# Patient Record
Sex: Female | Born: 1935 | Race: White | Hispanic: No | Marital: Married
Health system: Southern US, Community
[De-identification: ages and names within clinical notes are randomized; demographics above are authoritative.]

---

## 2011-04-30 ENCOUNTER — Ambulatory Visit: Payer: Self-pay | Admitting: Orthopedic Surgery

## 2011-05-14 ENCOUNTER — Ambulatory Visit: Payer: Self-pay | Admitting: Orthopedic Surgery

## 2011-05-29 ENCOUNTER — Inpatient Hospital Stay: Payer: Self-pay | Admitting: Orthopedic Surgery

## 2013-10-28 ENCOUNTER — Ambulatory Visit: Payer: Self-pay | Admitting: Orthopedic Surgery

## 2013-10-28 LAB — URINALYSIS, COMPLETE
Bacteria: NONE SEEN
Bilirubin,UR: NEGATIVE
Blood: NEGATIVE
Glucose,UR: NEGATIVE mg/dL (ref 0–75)
Ketone: NEGATIVE
Leukocyte Esterase: NEGATIVE
Nitrite: NEGATIVE
Protein: NEGATIVE
RBC,UR: <1 /HPF (ref 0–5)

## 2013-10-28 LAB — BASIC METABOLIC PANEL
Anion Gap: 3 — ABNORMAL LOW (ref 7–16)
BUN: 18 mg/dL (ref 7–18)
Calcium, Total: 9.1 mg/dL (ref 8.5–10.1)
Creatinine: 1.21 mg/dL (ref 0.60–1.30)
EGFR (Non-African Amer.): 43 — ABNORMAL LOW
Potassium: 4.1 mmol/L (ref 3.5–5.1)
Sodium: 142 mmol/L (ref 136–145)

## 2013-10-28 LAB — CBC
HCT: 37.9 % (ref 35.0–47.0)
HGB: 12.7 g/dL (ref 12.0–16.0)
MCH: 29.6 pg (ref 26.0–34.0)
MCV: 88 fL (ref 80–100)
Platelet: 141 10*3/uL — ABNORMAL LOW (ref 150–440)
RBC: 4.29 10*6/uL (ref 3.80–5.20)
RDW: 13.8 % (ref 11.5–14.5)
WBC: 6.5 10*3/uL (ref 3.6–11.0)

## 2013-11-04 ENCOUNTER — Inpatient Hospital Stay: Payer: Self-pay | Admitting: Orthopedic Surgery

## 2013-11-05 LAB — BASIC METABOLIC PANEL
Anion Gap: 8 (ref 7–16)
BUN: 12 mg/dL (ref 7–18)
Co2: 22 mmol/L (ref 21–32)
Creatinine: 1.05 mg/dL (ref 0.60–1.30)
EGFR (African American): 59 — ABNORMAL LOW
EGFR (Non-African Amer.): 51 — ABNORMAL LOW
Glucose: 97 mg/dL (ref 65–99)
Osmolality: 277 (ref 275–301)
Potassium: 3.6 mmol/L (ref 3.5–5.1)
Sodium: 139 mmol/L (ref 136–145)

## 2013-11-05 LAB — PLATELET COUNT: Platelet: 125 10*3/uL — ABNORMAL LOW (ref 150–440)

## 2013-11-06 LAB — PATHOLOGY REPORT

## 2013-11-07 LAB — CBC WITH DIFFERENTIAL/PLATELET
Basophil %: 0.4 %
Eosinophil %: 0.8 %
HCT: 29.9 % — ABNORMAL LOW (ref 35.0–47.0)
HGB: 10.2 g/dL — ABNORMAL LOW (ref 12.0–16.0)
Lymphocyte #: 1.5 10*3/uL (ref 1.0–3.6)
Lymphocyte %: 17.4 %
MCHC: 33.9 g/dL (ref 32.0–36.0)
MCV: 88 fL (ref 80–100)
Neutrophil #: 6.6 10*3/uL — ABNORMAL HIGH (ref 1.4–6.5)
Neutrophil %: 74.3 %
RBC: 3.39 10*6/uL — ABNORMAL LOW (ref 3.80–5.20)
RDW: 13.4 % (ref 11.5–14.5)
WBC: 8.9 10*3/uL (ref 3.6–11.0)

## 2013-11-07 LAB — URINALYSIS, COMPLETE
Bilirubin,UR: NEGATIVE
Blood: NEGATIVE
Nitrite: NEGATIVE
Specific Gravity: 1.024 (ref 1.003–1.030)

## 2013-11-12 LAB — CULTURE, BLOOD (SINGLE)

## 2015-03-18 NOTE — Consult Note (Signed)
Brief Consult Note: Diagnosis: 81 yr old female with feber,pleuritic chest pain and pneumonia.   Patient was seen by consultant.   Consult note dictated.   Comments: HCAP;on levaquin,follow wbc,blood cultures,fever curve,if afbrile,can d/c to rehab tomorrow with levaquin  750 mg daily for 5 days.  Electronic Signatures: Katha HammingKonidena, Tonilynn Bieker (MD)  (Signed 13-Dec-14 10:17)  Authored: Brief Consult Note   Last Updated: 13-Dec-14 10:17 by Katha HammingKonidena, Barak Bialecki (MD)

## 2015-03-18 NOTE — Discharge Summary (Signed)
PATIENT NAME:  Jessica Burton, Jessica Burton MR#:  [id number] DATE OF BIRTH:  Apr 25, 1934  DATE OF ADMISSION:  11/04/2013 DATE OF DISCHARGE:  11/07/2013   ADMITTING DIAGNOSIS:  Right hip osteoarthritis.   DISCHARGE DIAGNOSIS:  Right hip osteoarthritis.   PROCEDURE:  Right total hip replacement.   ANESTHESIA:  Spinal.   SURGEON:  Leitha Schuller, M.D.   ASSISTANT:  Cranston Neighbor, PA-C.   ESTIMATED BLOOD LOSS:  300 mL.   SPECIMENS:  Femoral head.   IMPLANTS:  Medacta Versa Fit cup DM size 50 with associated liner and S-28 mm head and a 3 lateralized AMS stem.   HISTORY OF PRESENT ILLNESS:  The patient is a 81 year old female who has had prior total knee replacement and did well. She has been having increasing pain in the right hip and no arthritis has been diagnosed on x-rays years ago. The patient has persistent groin pain that radiates to the inner thigh. The pain awakens her at night and she has difficulty with ambulation and activities of a day. She has difficulty getting around and performing activities of daily living secondary to pain.   PHYSICAL EXAMINATION:  LUNGS:  Clear to auscultation.  HEART:  Regular rate and rhythm.  HEENT:  Remarkable for partial lower dentures.  EXTREMITY EXAMINATION:  On exam she has 10 degrees internal rotation of the hip and 20 degrees of external rotation of the right hip. She has 20 degrees of internal rotation, 30 degrees of external rotation of the left hip. She has a slight hip flexion contracture. Distally she is neurovascularly intact. She is able to flex and extend the toes.   HOSPITAL COURSE:  The patient was admitted to the hospital on 11/04/2013. She had surgery that same day and was brought to the orthopaedic floor from the PAC-U in stable condition. On postoperative day 1, the patient's labs were stable. She had a hemoglobin of 11.2. Vital signs were stable. She did have a slight fever of 101.8. These did improve through the day with Tylenol. She had no  chest pain, shortness of breath or wheezing. The patient's vital signs remained stable. She had good progress with physical therapy on postoperative day 1 and 2. On postoperative day 2, the patient's vital signs remained stable. She had good progress with physical therapy. Her pain was well controlled. On postoperative day 3, the patient experienced fevers of 101.8, CXR/UA/cultures/CBC were ordered which showed early infiltrate in left lobe. Patient was seen by medicine, medicine started patient on levaquin IV, then recomended po levaquin  daily for 5 days. On post op day 4, patients fevers were improved. Patient progressed very well with therapy. She was ready for discharge to rehab facility. Condition to rehab stable.   CONDITION AT DISCHARGE:  Stable.   DISCHARGE INSTRUCTIONS:  The patient may gradually increase weight-bearing on the affected extremity. Thigh-high TED hose on both legs and remove 1 hour per 8-hour shift. Elevate the heels off the bed. Use incentive spirometry every hour while awake and encouraged cough and deep breathing. No concentrated sweets or sugar. Apply an ice pack to the affected area. Do not get the dressing or bandage wet or dirty. Call [facility] if the dressing gets water under it. Leave the dressing on. Call [facility] if any of the following occur:  Bright-red bleeding from the incision or wound, fever above 101.5 degrees, redness, swelling, or drainage at the incision. Call [facility] if the dressing gets water under it, also if  he has had any increased leg pain, numbness or weakness in legs or bowel or bladder symptoms. Physical therapy has been arranged at the rehab facility. She should follow up in [facility] in two weeks for staple removal.   DISCHARGE MEDICATIONS:  1.  Simvastatin 20 mg 1 tablet orally once a day at bedtime.  2.  Evista 60 mg 1 tab orally once a day in the morning.  3.  Protonix 40 mg 1  tablet orally once a day in the morning.  4.  Melatonin 3 mg 1 tab orally once day at bedtime.  5.  Amoxicillin 500 mg 4 caps orally once a day as needed for one hour prior to dental procedure.  6.  Vitamin D3 1000 international units one cap orally once a day.  7.  Metoprolol 100 mg 1 tablet orally once a day in the morning.  8.  Pepcid AC 20 mg 1 tablet orally once a day in the evening.  9.  Muro 128 5% ophthalmic solution 1 drop to each affected eye 2 times a day. 10.  Senna Plus 50 mg/8.6 mg oral tablet 1 tablet orally once a day at bedtime.  11.  Gas-X extra strength 1 tab orally once a day as needed. 12.  Biofreeze apply topically to affected area once a day as needed.  13.  Losartan 50 mg oral tablet 1 tablet orally once a day in the morning.  14.  Tylenol 500 mg oral tablet 1 tablet orally every 4 hours as needed for pain or temperature greater than 100.4.  15.  Nucynta 50 mg oral tablet 1 tablet orally every 4 to 6 hours as needed for pain. 16.  Magnesium hydroxide 8% oral suspension 30 mL orally 2 times a day as needed for constipation,  17.  Mylanta 30 mL orally every 6 hours as needed for indigestion or heartburn.  18.  Clonidine 0.1 mg oral tablet 1 tablet orally every 8 hours as needed for systolic blood pressure over 160.  19.  Lovenox 40 mg subcutaneous once a day x 14 days.  20.  Bisacodyl 10 mg rectal suppository one suppository rectally daily as needed for constipation.  ____________________________ T. Cranston Neighborhris Norina Cowper, PA-C tcg:jm D: 11/06/2013 12:43:00 ET T: 11/06/2013 13:23:14 ET JOB#: [id number]  cc: T. Cranston Neighborhris Makinzy Cleere, PA-C, <Dictator> Evon SlackHOMAS C Marvie Brevik GeorgiaPA ELECTRONICALLY SIGNED 11/17/2013 8:00

## 2015-03-18 NOTE — Op Note (Signed)
PATIENT NAME:  Jessica Burton, Jessica Burton MR#:  [id number] DATE OF BIRTH:  06/30/1934  DATE OF PROCEDURE:  11/04/2013  PREOPERATIVE DIAGNOSIS: Right hip osteoarthritis.   POSTOPERATIVE DIAGNOSIS: Right hip osteoarthritis.   PROCEDURE: Right total hip replacement.   ANESTHESIA: Spinal.   SURGEON: Kennedy BuckerMichael Akiva Brassfield, M.D.   ASSISTANT:  Cranston Neighborhris Gaines, PA-C.   PROCEDURE: Right total hip replacement.   DESCRIPTION OF PROCEDURE: The patient was brought to the operating room and after adequate anesthesia was obtained, the hip was prepped and draped in the usual sterile fashion. After patient identification, timeout procedures were completed, and having the right foot in the Medacta attachment and preprocedure picture taken, direct anterior approach was made with the incision centered over the greater trochanter and in line with the tensor fascia lata.  The incision was carried down through the skin and subcutaneous tissue with hemostasis being achieved with electrocautery. The TFL fascia was incised and the muscle retracted laterally. The deep fascia incised, and then the lateral femoral circumflex vessels were ligated. The anterior capsule was then exposed and a flap created to allow for exposure of the neck. A neck cut was then carried out and the head removed. Inspection the head revealed significant degenerative change present. The labrum was excised and sequential reaming was carried out after excising the ligamentum teres to 50 mm, at which point there was good bleeding bone and a stable trial implant. The final implant was impacted into place and lateral opening and anteversion appeared appropriate. Next, the leg was externally rotated. Pubofemoral and ischiofemoral ligaments released to allow for adequate mobilization and external rotation of the hip, followed by extension and adduction. The femur was prepared with a box osteotome, followed by sequential broaching. A #3 gave a tight fit and trials were placed. The  lateralized neck gave a better match to the preop template, and so the final stem chosen was the 3 lateral AMIS stem. This was impacted into place, and based on the trials, an S-28 mm head and dual mobility liner for the 50 mm dual mobility cup were assembled and impacted onto the head. The acetabulum was checked and was free of any debris. The hip was reduced and was very stable, stable to 90 degrees of external rotation test. The wound was thoroughly irrigated prior to closure. A running heavy quill suture was used for the deep fascia with 30 mL of 0.25% Sensorcaine with epinephrine infiltrated in the periarticular tissues. A subcutaneous drain was placed, followed by 2-0 quill subcutaneously, skin staples, Xeroform, 4 x 4's, ABD and tape. Leg lengths appeared equal clinically and during x-rays taken during the procedure.  Condition to recovery room is stable.   ESTIMATED BLOOD LOSS: 300.   SPECIMENS: Femoral head.   IMPLANTS: Medacta Versafitcup DM size 50 with associated liner, an S-28 mm head and a 3  lateralized AMIS stem.    ____________________________ Leitha SchullerMichael J. Sheria Rosello, MD mjm:dmm D: 11/04/2013 09:41:04 ET T: 11/04/2013 09:56:19 ET JOB#: [id number]  cc: Leitha SchullerMichael J. Sharika Mosquera, MD, <Dictator> Leitha SchullerMICHAEL J Thornton Dohrmann MD ELECTRONICALLY SIGNED 11/04/2013 17:38

## 2015-03-19 NOTE — Consult Note (Signed)
PATIENT NAME:  Jessica FredricksonDRUMHELLER, Chloris MR#:  [id number] DATE OF BIRTH:  1934-04-19  DATE OF CONSULTATION:  11/07/2013  CONSULTING PHYSICIAN:  Katha HammingSnehalatha Sherre Wooton, MD  [place]  [place]    REQUESTING PHYSICIAN:  Dr. Rosita KeaMenz.   REASON FOR CONSULTATION:   Fever and pneumonia.   HISTORY OF PRESENT ILLNESS:   A 81 year old female patient admitted to the orthopedic service for hip replacement. The patient had total hip arthroplasty on the right side by Dr. Rosita KeaMenz and she is supposed to go to rehab today, but she developed fever last night and this morning temperature is up to 101.7. The patient is having a cough since yesterday, unable to get the phlegm out. No trouble breathing. The patient also has pleuritic chest pain on both right side and left side. The patient denies any abdominal pain. No nausea. No vomiting. No dysuria.   PAST MEDICAL HISTORY:  Significant for hypertension, hyperlipidemia, and osteoporosis.   ALLERGIES: ACE INHIBITORS, CODEINE AND ZOFRAN,  contrast dye SOCIAL HISTORY: No smoking. No drinking. No drug.   PAST SURGICAL HISTORY: Significant for histor> knee replacement and also right hip surgery now. Left knee arthroscopy, history of bilateral oophorectomy, history of cataract surgery, history of C-section and appendectomy.    PRESENT MEDICATIONS:  Losartan 50 mg in the morning, milk of magnesium as needed, melatonin 3 mg at bedtime, metoprolol succinate ER 100 mg in the morning. She is on morphine for pain and she is also on clonidine 0.1 mg every 8 hours p.r.n. She is on Dulcolax, famotidine 20 mg daily. She is also on Senna Plus for constipation, and she is on Lovenox 30 mg q.12 hours.   FAMILY HISTORY: No hypertension or diabetes.   SOCIAL HISTORY:  [place] in [place] she came here to have surgery with Dr. Rosita KeaMenz.   REVIEW OF SYSTEMS:   CONSTITUTIONAL: Has fever and some cough.  CARDIOVASCULAR: Has pleuritic chest  pain. PULMONARY: As cough.    GASTROINTESTINAL: No nausea. No vomiting. No diarrhea.  GENITOURINARY: No dysuria.  MUSCULOSKELETAL: Had hip replacement, getting physical therapy for that.  NEUROLOGIC: No history of strokes or TIAs.  PSYCHIATRIC: No anxiety or insomnia.   PHYSICAL EXAMINATION: VITAL SIGNS: Temperature 101.7 his maximum and heart rate is 75, blood pressure 156/69, sats 98% at rest on room air.  GENERAL: The patient is alert, awake, oriented, elderly female, not in distress. Answering questions appropriately.  HEENT: Head atraumatic, normocephalic. Pupils equally reacting to light. No conjunctival congestion.  ENT: The patient has no tympanic membrane congestion.  NOSE: The patient complains of nose bleed last night due to dry air.  THROAT: No oropharyngeal erythema. Has dry mucosa.  NECK: Supple. No masses. No lymphadenopathy. Thyroid in the midline.  RESPIRATORY: Normal respiratory effort. Clear to auscultation. Both sides. No wheezing. No crackles.  CARDIOVASCULAR: S1, S2 regular rate.  The patient has no murmurs,  Positive carotid upstroke. No pedal edema. The patient has good peripheral pulse. No JVD.  ABDOMEN: Soft, nontender, nondistended. Bowel sounds present. No organomegaly. No hernias. The patient has no CVA tenderness.  LYMPHATICS: No lymphadenopathy in cervical or axillary.  EXTREMITIES: No cyanosis.  SKIN: Warm and dry. Skin turgor is normal. No skin ulcers. NEUROLOGICAL: Cranial nerves II through 12 intact. Power 5/5 upper and lower extremities. Following commands. Sensation is intact. Deep tendon reflexes 2+ bilaterally.  PSYCHIATRIC: Oriented to time, place, person.   LABORATORY DATA: Chest x-ray this morning shows left basilar  atelectasis, could represent early infiltrate.   WBC 8.9, hemoglobin 10.2, hematocrit 29.9, platelets 124.   ELECTROLYTES: From 12/11 showed sodium 139, potassium 3.6, chloride 109, bicarbonate 22, BUN 12, creatinine 1, glucose 97.    ASSESSMENT AND PLAN:   1.  The patient is a 81 year old female with pneumonia with cough and fever. Please keep the patient in the hospital today. Continue IV Levaquin and follow the blood cultures and follow the fever curve. If the patient is afebrile today, she can go to rehab tomorrow with Levaquin for 5 days 750 mg daily.  2.  Hypertension. Blood pressure is controlled. She is on Losartan and also metoprolol. Continue that.  3.  History of hip arthroplasty. Physical therapy with deep vein thrombosis prophylaxis as per the orthopedics.  4.  Pleuritic chest pain. Continue Tylenol as they are doing now and the patient is also on Nucynta but advise the patient to [place] incentive spirometry.  Condition is stable.   Thank you for asking [place] to see this patient.   TIME SPENT: About 55 minutes on this consult.     ____________________________ Katha Hamming, MD sk:dp D: 11/07/2013 10:16:06 ET T: 11/07/2013 10:42:49 ET JOB#: [id number]  cc: Katha Hamming, MD, <Dictator> Katha Hamming MD ELECTRONICALLY SIGNED 11/29/2013 17:09

## 2015-07-17 IMAGING — CR DG C-ARM 1-60 MIN
2 series · 2 of 2 positions shown · non-contrast
Comparison: None.

CLINICAL DATA: Fracture reduction and fixation.

EXAM:
Intraoperative radiographs.

[cont. (1 of 2)]
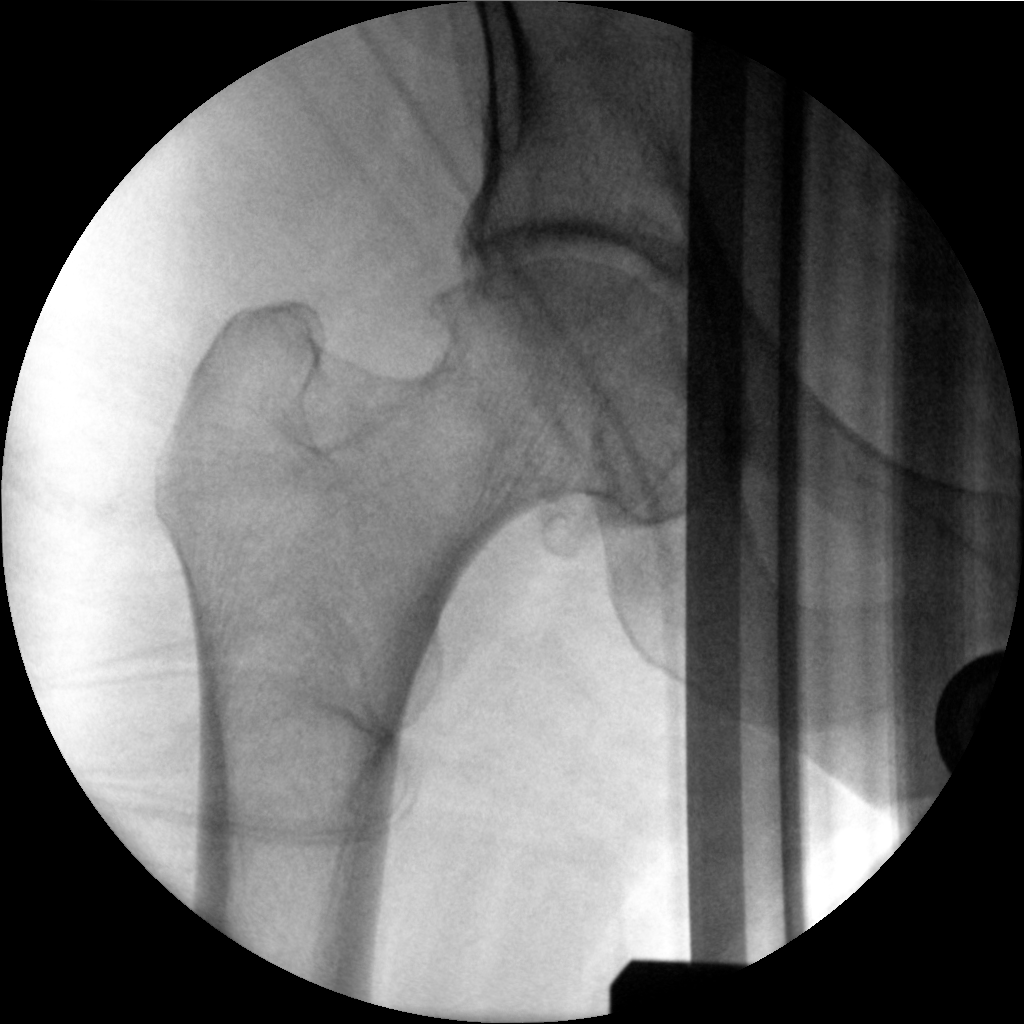

[cont. (2 of 2)]
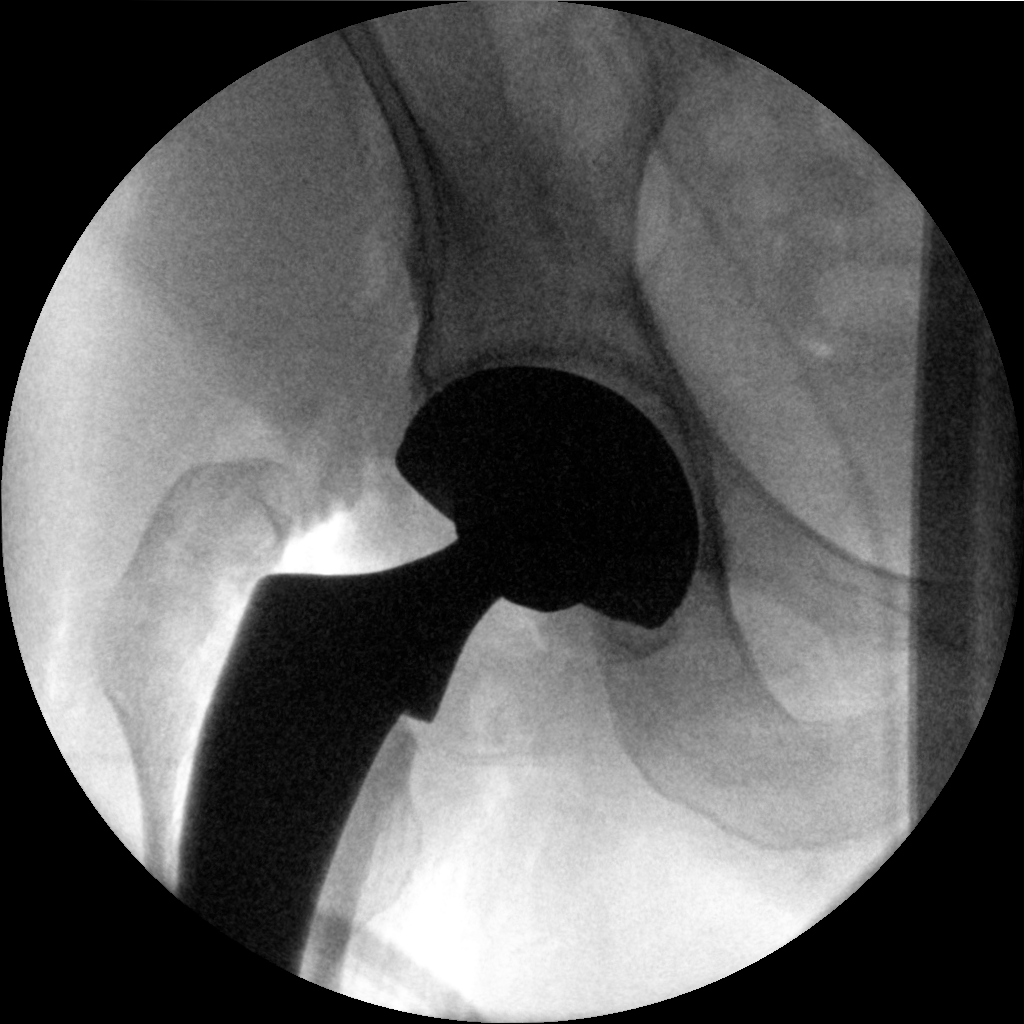

[2 of 2 positions shown; findings below may reference images not displayed]

FINDINGS: Two intraoperative fluoro spot images are provided. The patient is
status post right total hip arthroplasty on the 2nd image. The
entirety of the femoral component is not visualized. The hip appears
located. A 2nd view was not provided to confirm.
IMPRESSION: 1. Status post right total hip arthroplasty without radiographic
evidence for complication.
2. The distal portion of the femoral component is incompletely
visualized.
3. A 2nd view is not provided to confirm that the hip is located.
The arthroplasty does appear located.

:

## 2015-07-20 IMAGING — CR DG CHEST 2V
1 series · 2 of 2 positions shown · non-contrast
Comparison: None.

CLINICAL DATA: Fever and cough

EXAM:
CHEST  2 VIEW

[Series 1: w chest pa · 0.14mm/px · 2 of 2 slices shown]
[im 1/2]
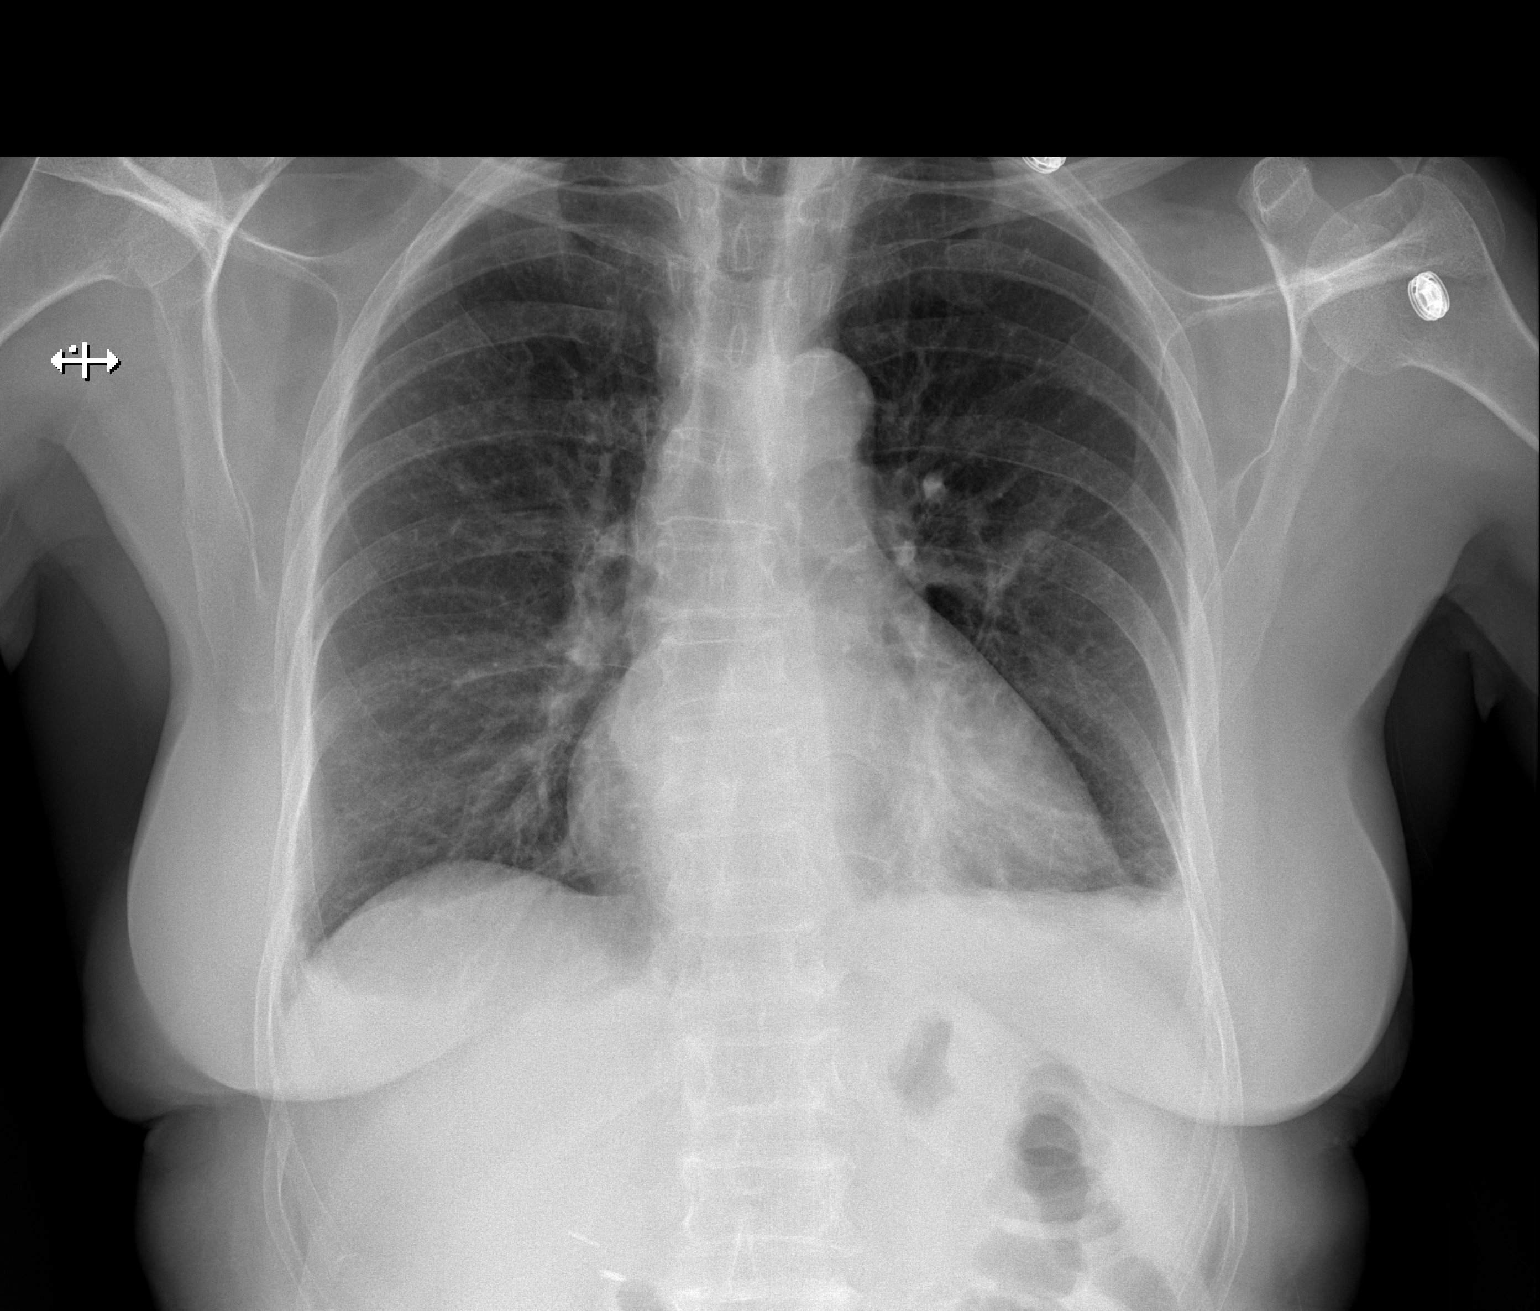
[im 2/2]
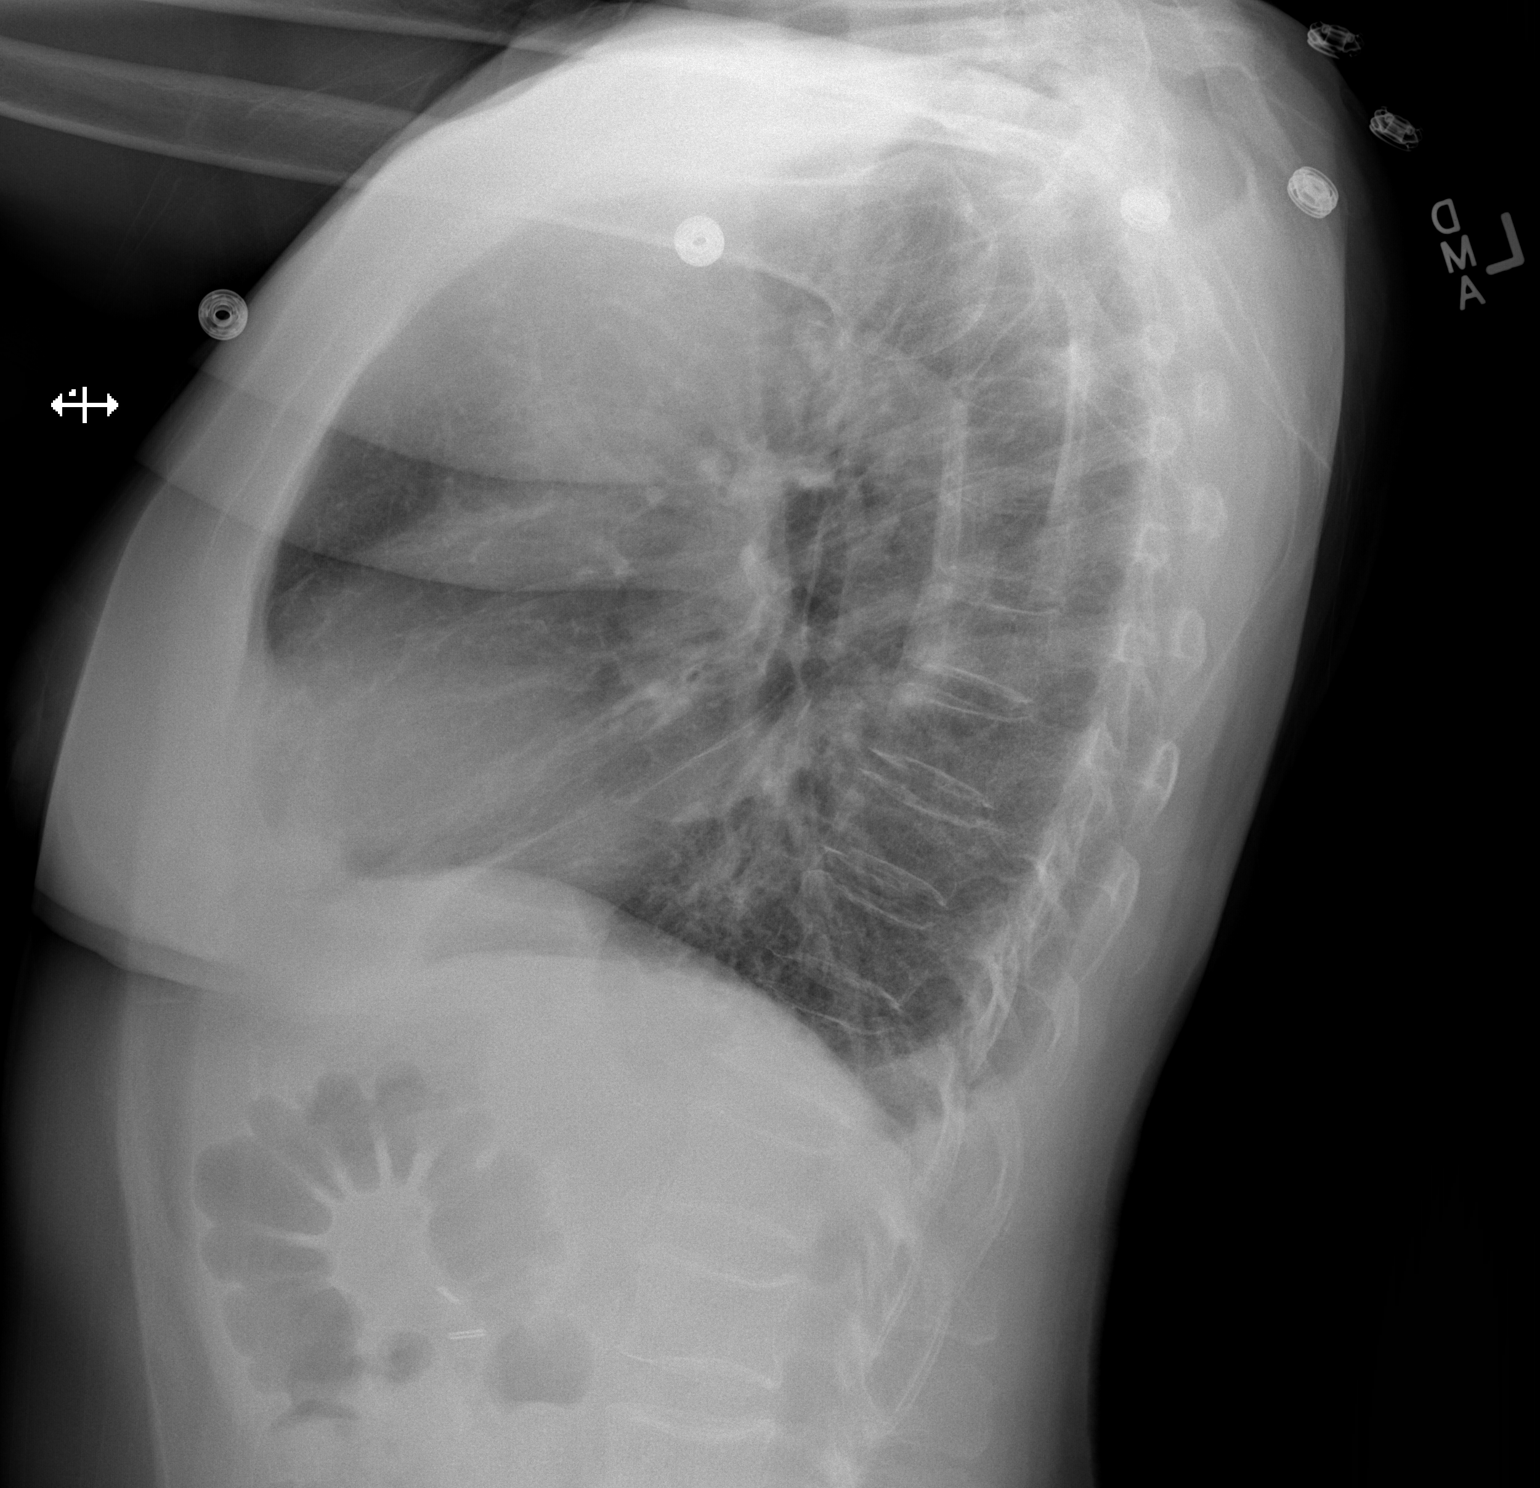

[2 of 2 positions shown; findings below may reference images not displayed]

FINDINGS: The heart and pulmonary vascularity are within normal limits. The
lungs are well aerated bilaterally and demonstrate mild atelectasis
with effusion on the left. No other focal abnormality is seen.
IMPRESSION: Left basilar atelectasis.  This could represent an early infiltrate.
